# Patient Record
Sex: Male | Born: 1960 | Race: White | Hispanic: No | Smoking: Current every day smoker
Health system: Southern US, Community
[De-identification: ages and names within clinical notes are randomized; demographics above are authoritative.]

## PROBLEM LIST (undated history)

## (undated) DIAGNOSIS — F909 Attention-deficit hyperactivity disorder, unspecified type: Secondary | ICD-10-CM

---

## 2000-10-19 ENCOUNTER — Emergency Department (HOSPITAL_COMMUNITY): Admission: EM | Admit: 2000-10-19 | Discharge: 2000-10-19 | Payer: Self-pay | Admitting: Emergency Medicine

## 2000-10-19 ENCOUNTER — Encounter: Payer: Self-pay | Admitting: Emergency Medicine

## 2007-08-26 ENCOUNTER — Emergency Department: Payer: Self-pay

## 2014-09-23 ENCOUNTER — Emergency Department (HOSPITAL_COMMUNITY): Payer: Medicare Other

## 2014-09-23 ENCOUNTER — Emergency Department (HOSPITAL_COMMUNITY)
Admission: EM | Admit: 2014-09-23 | Discharge: 2014-09-23 | Disposition: A | Discharge status: Home / Self Care | Payer: Medicare Other | Attending: Emergency Medicine | Admitting: Emergency Medicine

## 2014-09-23 ENCOUNTER — Encounter (HOSPITAL_COMMUNITY): Payer: Self-pay | Admitting: Emergency Medicine

## 2014-09-23 DIAGNOSIS — Y9289 Other specified places as the place of occurrence of the external cause: Secondary | ICD-10-CM | POA: Diagnosis not present

## 2014-09-23 DIAGNOSIS — Z720 Tobacco use: Secondary | ICD-10-CM | POA: Insufficient documentation

## 2014-09-23 DIAGNOSIS — S8262XA Displaced fracture of lateral malleolus of left fibula, initial encounter for closed fracture: Secondary | ICD-10-CM | POA: Diagnosis not present

## 2014-09-23 DIAGNOSIS — Y9389 Activity, other specified: Secondary | ICD-10-CM | POA: Diagnosis not present

## 2014-09-23 DIAGNOSIS — Z8659 Personal history of other mental and behavioral disorders: Secondary | ICD-10-CM | POA: Diagnosis not present

## 2014-09-23 DIAGNOSIS — W11XXXA Fall on and from ladder, initial encounter: Secondary | ICD-10-CM | POA: Diagnosis not present

## 2014-09-23 DIAGNOSIS — Y998 Other external cause status: Secondary | ICD-10-CM | POA: Insufficient documentation

## 2014-09-23 DIAGNOSIS — S51802A Unspecified open wound of left forearm, initial encounter: Secondary | ICD-10-CM | POA: Insufficient documentation

## 2014-09-23 DIAGNOSIS — R001 Bradycardia, unspecified: Secondary | ICD-10-CM | POA: Insufficient documentation

## 2014-09-23 DIAGNOSIS — Z23 Encounter for immunization: Secondary | ICD-10-CM | POA: Insufficient documentation

## 2014-09-23 DIAGNOSIS — S51012A Laceration without foreign body of left elbow, initial encounter: Secondary | ICD-10-CM | POA: Diagnosis not present

## 2014-09-23 DIAGNOSIS — W19XXXA Unspecified fall, initial encounter: Secondary | ICD-10-CM

## 2014-09-23 DIAGNOSIS — S8992XA Unspecified injury of left lower leg, initial encounter: Secondary | ICD-10-CM | POA: Diagnosis present

## 2014-09-23 HISTORY — DX: Attention-deficit hyperactivity disorder, unspecified type: F90.9

## 2014-09-23 MED ORDER — OXYCODONE-ACETAMINOPHEN 5-325 MG PO TABS: 1.0000 | ORAL_TABLET | Freq: Three times a day (TID) | ORAL | Status: AC | PRN

## 2014-09-23 MED ORDER — OXYCODONE-ACETAMINOPHEN 5-325 MG PO TABS
2.0000 | ORAL_TABLET | Freq: Once | ORAL | Status: DC
  Filled 2014-09-23: qty 2

## 2014-09-23 MED ORDER — KETOROLAC TROMETHAMINE 60 MG/2ML IM SOLN
30.0000 mg | Freq: Once | INTRAMUSCULAR | Status: AC
  Administered 2014-09-23: 30 mg via INTRAMUSCULAR
  Filled 2014-09-23: qty 2

## 2014-09-23 MED ORDER — TETANUS-DIPHTH-ACELL PERTUSSIS 5-2.5-18.5 LF-MCG/0.5 IM SUSP
0.5000 mL | Freq: Once | INTRAMUSCULAR | Status: AC
  Administered 2014-09-23: 0.5 mL via INTRAMUSCULAR
  Filled 2014-09-23: qty 0.5

## 2014-09-23 NOTE — ED Notes (Signed)
Ortho tech called and is on the way down to apply cam walker and provide pt with crutches.

## 2014-09-23 NOTE — ED Notes (Signed)
Ortho at bedside.

## 2014-09-23 NOTE — ED Notes (Signed)
Pt. Stated, I fell from a ladder working on ceiling.  I fell on the left side. My arm is hurt and numb, my back hurts, knee, and ankle. Happened about 30 minutes ago.

## 2014-09-23 NOTE — ED Notes (Signed)
Ortho tech brought crutches to pt.  Pt refuses crutches.  RN explained to pt that no weight could be placed on left foot.  Pt verbalized understanding but still adamantly refused the crutches.  Pt escorted out by wheelchair.

## 2014-09-23 NOTE — ED Notes (Signed)
Went in to discuss plan of care with pt.  Pt is wishing to refuse hand Xray at this time.  Pt sts "It's not broken and I'm just ready to go home after they get my leg fixed up."  MD made aware.

## 2014-09-23 NOTE — ED Provider Notes (Signed)
CSN: [id number]     Arrival date & time 09/23/14  1136 History   First MD Initiated Contact with Patient 09/23/14 1422     Chief Complaint  Patient presents with  . Fall  . Arm Pain  . Back Pain  . Knee Injury     (Consider location/radiation/quality/duration/timing/severity/associated sxs/prior Treatment) HPI   53 year old male presents to the ER for evaluation of a recent fall. Patient fell approximately 3 hours ago off a ladder while working on a ceiling. 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 He was applying sheet rock while standing on a 6 foot ladder when a coworker accidentally walked by and knocked the ladder causing him to fell backward striking body against wall and concrete floor. He hits several middle pipes that was stuck out in the wall when he fell. He denies hitting his head or loss of consciousness. He is complaining of acute onset of left elbow, left wrist, left hand, lower back, and left ankle pain from the fall. Pain is sharp, throbbing, achy, worsening with palpation and movement. No specific treatment tried. Patient is not on any blood thinning medication. She reported initially his left arm was numb but that has resolved. He denies any precipitating symptoms prior to the fall. He denies wearing any head protective gear. No complaint of neck pain, chest pain, abdominal pain, trouble breathing, new numbness or weakness.  Past Medical History  Diagnosis Date  . ADHD (attention deficit hyperactivity disorder)    History reviewed. No pertinent past surgical history. No family history on file. History  Substance Use Topics  . Smoking status: Current Every Day Smoker  . Smokeless tobacco: Not on file  . Alcohol Use: No    Review of Systems  All other systems reviewed and are negative.     Allergies  Review of patient's allergies indicates not on file.  Home Medications   Prior to Admission medications    Not on File   BP 111/59 mmHg  Pulse 49  Temp(Src) 98 F (36.7 C) (Oral)  Resp 12  SpO2 97% Physical Exam  Constitutional: He appears well-developed and well-nourished. No distress.  HENT:  Head: Atraumatic.  No scalp tenderness, no mid face tenderness  Eyes: Conjunctivae and EOM are normal. Pupils are equal, round, and reactive to light.  Neck: Normal range of motion. Neck supple.  No cervical midline spine tenderness  Cardiovascular:  Bradycardia without murmur rubs or gallops  Pulmonary/Chest: Effort normal and breath sounds normal. He exhibits no tenderness.  Abdominal: Soft. There is no tenderness.  Musculoskeletal: He exhibits tenderness (Left elbow: Tenderness to medial epicondyles with normal elbow flexion extension. Left wrist with full range of motion and no deformity. Tenderness to pad of left third finger with swelling.).  Left ankle with moderate tenderness to lateral malleolus region without crepitus. Increased pain with ankle dorsi and plantarflexion and inversion and eversion. Pedal pulse palpable with brisk cap refill.  Neurological: He is alert.  Skin: No rash noted.  A 5 cm superficial skin tear noted to old aspects of proximal left forearm without active bleeding, or deep cut. No crepitus.  A 1 cm superficial skin tear to medial aspects of left elbow without deep laceration.  Psychiatric: He has a normal mood and affect.  Nursing note and vitals reviewed.   ED Course  Procedures (including critical care time)  2:47 PM Patient fell off a 6 foot ladder and striking left side of body against  the wall and the ground. X-ray obtain demonstrating an avulsion fracture on the left ankle. This is a closed injury. Cam walker will be placed, non weight bearing, crutches given.  Pain medication given. Patient has superficial skin tear that would be cleans, and dressed appropriately. Tetanus shot given. Patient will receive a work note and also will be given orthopedic  referral for outpatient follow-up.  LACERATION REPAIR Performed by: Fayrene HelperRAN,Adra Shepler Authorized byFayrene Helper: Justene Jensen Consent: Verbal consent obtained. Risks and benefits: risks, benefits and alternatives were discussed Consent given by: patient Patient identity confirmed: provided demographic data Prepped and Draped in normal sterile fashion Wound explored  Laceration Location: L elbow  Laceration Length: 1cm  No Foreign Bodies seen or palpated  Anesthesia: local infiltration  Local anesthetic: none  Anesthetic total: 0 ml  Irrigation method: syringe Amount of cleaning: standard  Skin closure: sterile strip  Number of sutures: sterile strip  Technique: sterile strip  Patient tolerance: Patient tolerated the procedure well with no immediate complications.    Labs Review Labs Reviewed - No data to display  Imaging Review Dg Lumbar Spine Complete  09/23/2014   CLINICAL DATA:  Status post fall from ladder today were now with low back pain and stiffness  EXAM: LUMBAR SPINE - COMPLETE 4+ VIEW  COMPARISON:  None.  FINDINGS: The lumbar vertebral bodies are preserved in height. The intervertebral disc space heights are well maintained. There are small anterior endplate spurs at multiple levels. There is no spondylolisthesis. There is no spinous process fracture. The pedicles and transverse processes are intact where visualized. The observed portions of the sacrum are normal.  IMPRESSION: There is no acute bony abnormality of the lumbar spine. Very mild degenerative endplate spurring is present at multiple levels.   Electronically Signed   By: David  Swazil[place]Jordan   On: 09/23/2014 13:38   Dg Elbow Complete Left  09/23/2014   CLINICAL DATA:  Larey SeatFell off ladder today work. Lacerations to medial left elbow.  EXAM: LEFT ELBOW - COMPLETE 3+ VIEW  COMPARISON:  None.  FINDINGS: The elbow is located. There are degenerative changes at the elbow joint, with some bony spurring. No fracture. Elbow is located.  Negative for joint effusion or focal soft tissue swelling.  IMPRESSION: No acute osseous abnormality or joint effusion identified.  Degenerative changes of the elbow joint.   Electronically Signed   By: Britta MccreedySusan  Turner M.D.   On: 09/23/2014 13:34   Dg Wrist Complete Left  09/23/2014   CLINICAL DATA:  Larey SeatFell off ladder today at work, ulnar pain LEFT wrist, initial encounter  EXAM: LEFT WRIST - COMPLETE 3+ VIEW  COMPARISON:  None  FINDINGS: Osseous mineralization normal.  Joint spaces preserved.  No fracture, dislocation, or bone destruction.  IMPRESSION: Normal exam.   Electronically Signed   By: Ulyses SouthwardMark  Boles M.D.   On: 09/23/2014 13:33   Dg Ankle Complete Left  09/23/2014   CLINICAL DATA:  Status post fall from a ladder today or now with left lateral malleolar region pain  EXAM: LEFT ANKLE COMPLETE - 3+ VIEW  COMPARISON:  None.  FINDINGS: The ankle joint mortise is preserved. The talar dome is intact. There is a bony density which projects along the posterior lateral cortex of the lateral malleolus which may reflect an avulsion fracture fragment. It is demonstrated best on the lateral film but a tiny bony density is faintly visible on the AP view. The medial malleolus exhibits degenerative change. The posterior malleolus is intact. There is a plantar calcaneal  spur.  IMPRESSION: There is a tiny bony density which may reflect an avulsion from the posterior cortex of the lateral malleolus or possibly from the adjacent calcaneus. There is no dislocation.   Electronically Signed   By: David  [place]   On: 09/23/2014 13:35     EKG Interpretation None      MDM   Final diagnoses:  Fall from ladder, initial encounter  Fractured lateral malleolus, left, closed, initial encounter  Skin tear of elbow without complication, left, initial encounter    BP 117/61 mmHg  Pulse 46  Temp(Src) 98 F (36.7 C) (Oral)  Resp 16  SpO2 97% Asymptomatic bradycardia.     I have reviewed nursing notes and vital  signs. I personally reviewed the imaging tests through PACS system  I reviewed available ER/hospitalization records thought the EMR     Fayrene HelperBowie Latresha Yahr, PA-C 09/23/14 1532  Lyanne CoKevin M Campos, MD 09/23/14 [phone]

## 2014-09-23 NOTE — Progress Notes (Signed)
Orthopedic Tech Progress Note Patient Details:  Daniel CoilMark A Espinoza 1961-05-03 [id number] CAM walker applied. Crutches refused. Patient given dc instructions and RICE method explained.  Ortho Devices Type of Ortho Device: CAM walker Ortho Device/Splint Location: LLE Ortho Device/Splint Interventions: Application   Asia R Thompson 09/23/2014, 3:42 PM

## 2014-09-23 NOTE — Discharge Instructions (Signed)
You have a broken left ankle.  Do not bear weight, wear cam walker and use crutches to walk.  Follow up with orthopedic specialist next week for further care.  Take pain medication as needed but do not drive or operate heavy machinery while taking pain medication.  If you notice signs of infection from the wound, please return.    Ankle Fracture A fracture is a break in a bone. The ankle joint is made up of three bones. These include the lower (distal)sections of your lower leg bones, called the tibia and fibula, along with a bone in your foot, called the talus. Depending on how bad the break is and if more than one ankle joint bone is broken, a cast or splint is used to protect and keep your injured bone from moving while it heals. Sometimes, surgery is required to help the fracture heal properly.  There are two general types of fractures:  Stable fracture. This includes a single fracture line through one bone, with no injury to ankle ligaments. A fracture of the talus that does not have any displacement (movement of the bone on either side of the fracture line) is also stable.  Unstable fracture. This includes more than one fracture line through one or more bones in the ankle joint. It also includes fractures that have displacement of the bone on either side of the fracture line. CAUSES  A direct blow to the ankle.   Quickly and severely twisting your ankle.  Trauma, such as a car accident or falling from a significant height. RISK FACTORS You may be at a higher risk of ankle fracture if:  You have certain medical conditions.  You are involved in high-impact sports.  You are involved in a high-impact car accident. SIGNS AND SYMPTOMS   Tender and swollen ankle.  Bruising around the injured ankle.  Pain on movement of the ankle.  Difficulty walking or putting weight on the ankle.  A cold foot below the site of the ankle injury. This can occur if the blood vessels passing through  your injured ankle were also damaged.  Numbness in the foot below the site of the ankle injury. DIAGNOSIS  An ankle fracture is usually diagnosed with a physical exam and X-rays. A CT scan may also be required for complex fractures. TREATMENT  Stable fractures are treated with a cast or splint and using crutches to avoid putting weight on your injured ankle. This is followed by an ankle strengthening program. Some patients require a special type of cast, depending on other medical problems they may have. Unstable fractures require surgery to ensure the bones heal properly. Your health care provider will tell you what type of fracture you have and the best treatment for your condition. HOME CARE INSTRUCTIONS   Review correct crutch use with your health care provider and use your crutches as directed. Safe use of crutches is extremely important. Misuse of crutches can cause you to fall or cause injury to nerves in your hands or armpits.  Do not put weight or pressure on the injured ankle until directed by your health care provider.  To lessen the swelling, keep the injured leg elevated while sitting or lying down.  Apply ice to the injured area:  Put ice in a plastic bag.  Place a towel between your cast and the bag.  Leave the ice on for 20 minutes, 2-3 times a day.  If you have a plaster or fiberglass cast:  Do not try  to scratch the skin under the cast with any objects. This can increase your risk of skin infection.  Check the skin around the cast every day. You may put lotion on any red or sore areas.  Keep your cast dry and clean.  If you have a plaster splint:  Wear the splint as directed.  You may loosen the elastic around the splint if your toes become numb, tingle, or turn cold or blue.  Do not put pressure on any part of your cast or splint; it may break. Rest your cast only on a pillow the first 24 hours until it is fully hardened.  Your cast or splint can be protected  during bathing with a plastic bag sealed to your skin with medical tape. Do not lower the cast or splint into water.  Take medicines as directed by your health care provider. Only take over-the-counter or prescription medicines for pain, discomfort, or fever as directed by your health care provider.  Do not drive a vehicle until your health care provider specifically tells you it is safe to do so.  If your health care provider has given you a follow-up appointment, it is very important to keep that appointment. Not keeping the appointment could result in a chronic or permanent injury, pain, and disability. If you have any problem keeping the appointment, call the facility for assistance. SEEK MEDICAL CARE IF: You develop increased swelling or discomfort. SEEK IMMEDIATE MEDICAL CARE IF:   Your cast gets damaged or breaks.  You have continued severe pain.  You develop new pain or swelling after the cast was put on.  Your skin or toenails below the injury turn blue or gray.  Your skin or toenails below the injury feel cold, numb, or have loss of sensitivity to touch.  There is a bad smell or pus draining from under the cast. MAKE SURE YOU:   Understand these instructions.  Will watch your condition.  Will get help right away if you are not doing well or get worse. Document Released: 10/11/2000 Document Revised: 10/19/2013 Document Reviewed: 05/13/2013 [facility] Patient Information 2015 [place], [place]. This information is not intended to replace advice given to you by your health care provider. Make sure you discuss any questions you have with your health care provider.  Stitches, Staples, or Skin Adhesive Strips  Stitches (sutures), staples, and skin adhesive strips hold the skin together as it heals. They will usually be in place for 7 days or less. HOME CARE  Wash your hands with soap and water before and after you touch your wound.  Only take medicine as told by your  doctor.  Cover your wound only if your doctor told you to. Otherwise, leave it open to air.  Do not get your stitches wet or dirty. If they get dirty, dab them gently with a clean washcloth. Wet the washcloth with soapy water. Do not rub. Pat them dry gently.  Do not put medicine or medicated cream on your stitches unless your doctor told you to.  Do not take out your own stitches or staples. Skin adhesive strips will fall off by themselves.  Do not pick at the wound. Picking can cause an infection.  Do not miss your follow-up appointment.  If you have problems or questions, call your doctor. GET HELP RIGHT AWAY IF:   You have a temperature by mouth above 102 F (38.9 C), not controlled by medicine.  You have chills.  You have redness or pain around  your stitches.  There is puffiness (swelling) around your stitches.  You notice fluid (drainage) from your stitches.  There is a bad smell coming from your wound. MAKE SURE YOU:  Understand these instructions.  Will watch your condition.  Will get help if you are not doing well or get worse. Document Released: 08/11/2009 Document Revised: 01/06/2012 Document Reviewed: 08/11/2009 [facility] Patient Information 2015 [place]. This information is not intended to replace advice given to you by your health care provider. Make sure you discuss any questions you have with your health care provider.

## 2016-01-03 IMAGING — CR DG ANKLE COMPLETE 3+V*L*
3 series · 3 of 3 positions shown · non-contrast
Comparison: None.

CLINICAL DATA: Status post fall from a ladder today or now with
left lateral malleolar region pain

EXAM:
LEFT ANKLE COMPLETE - 3+ VIEW

[ankle ap]
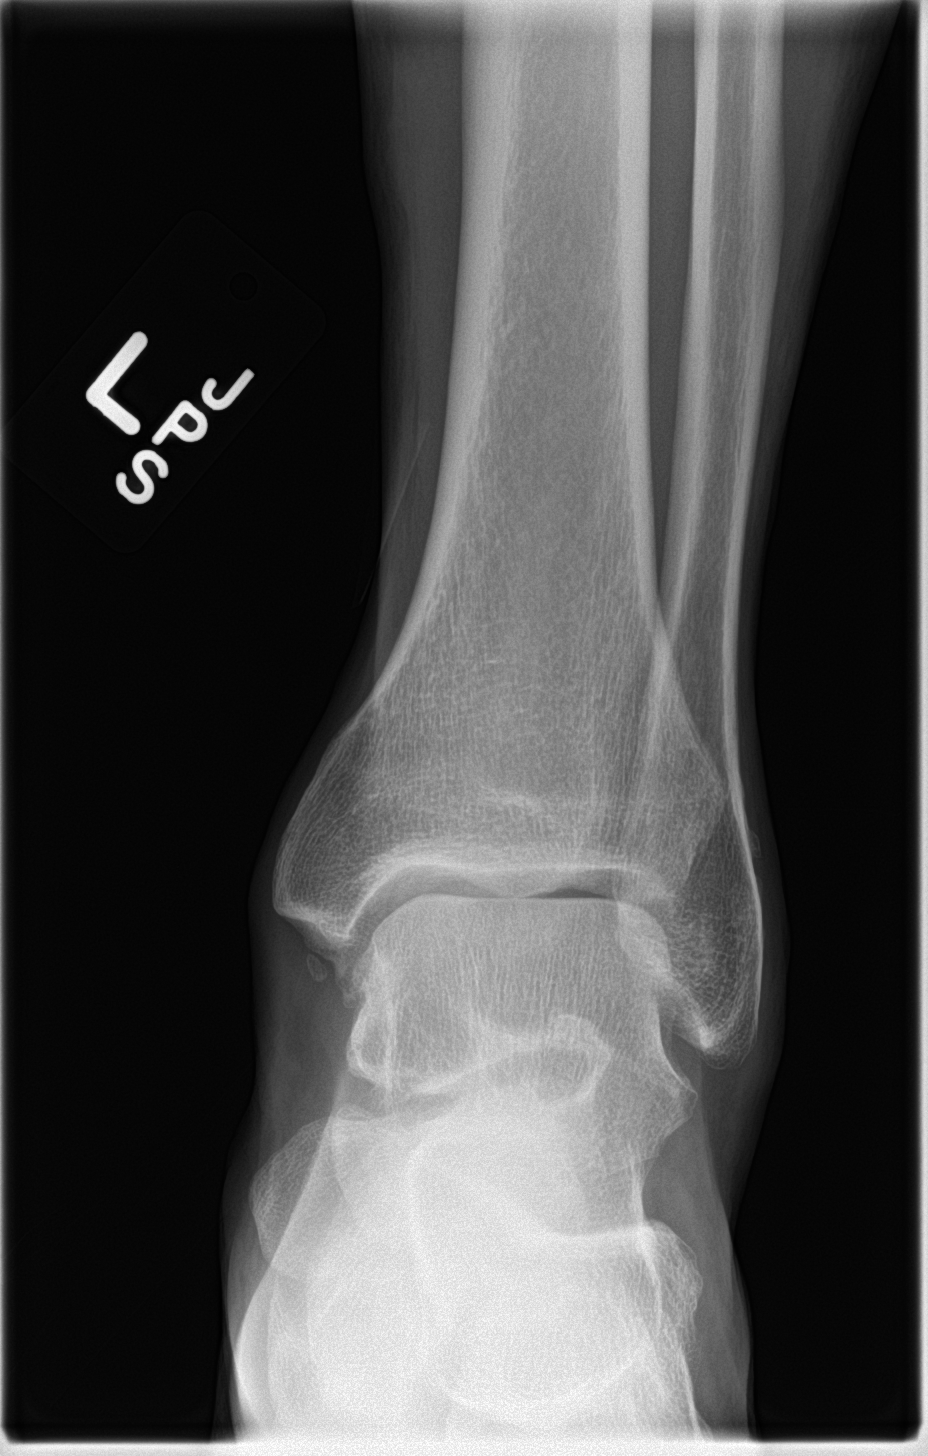

[ankle obl]
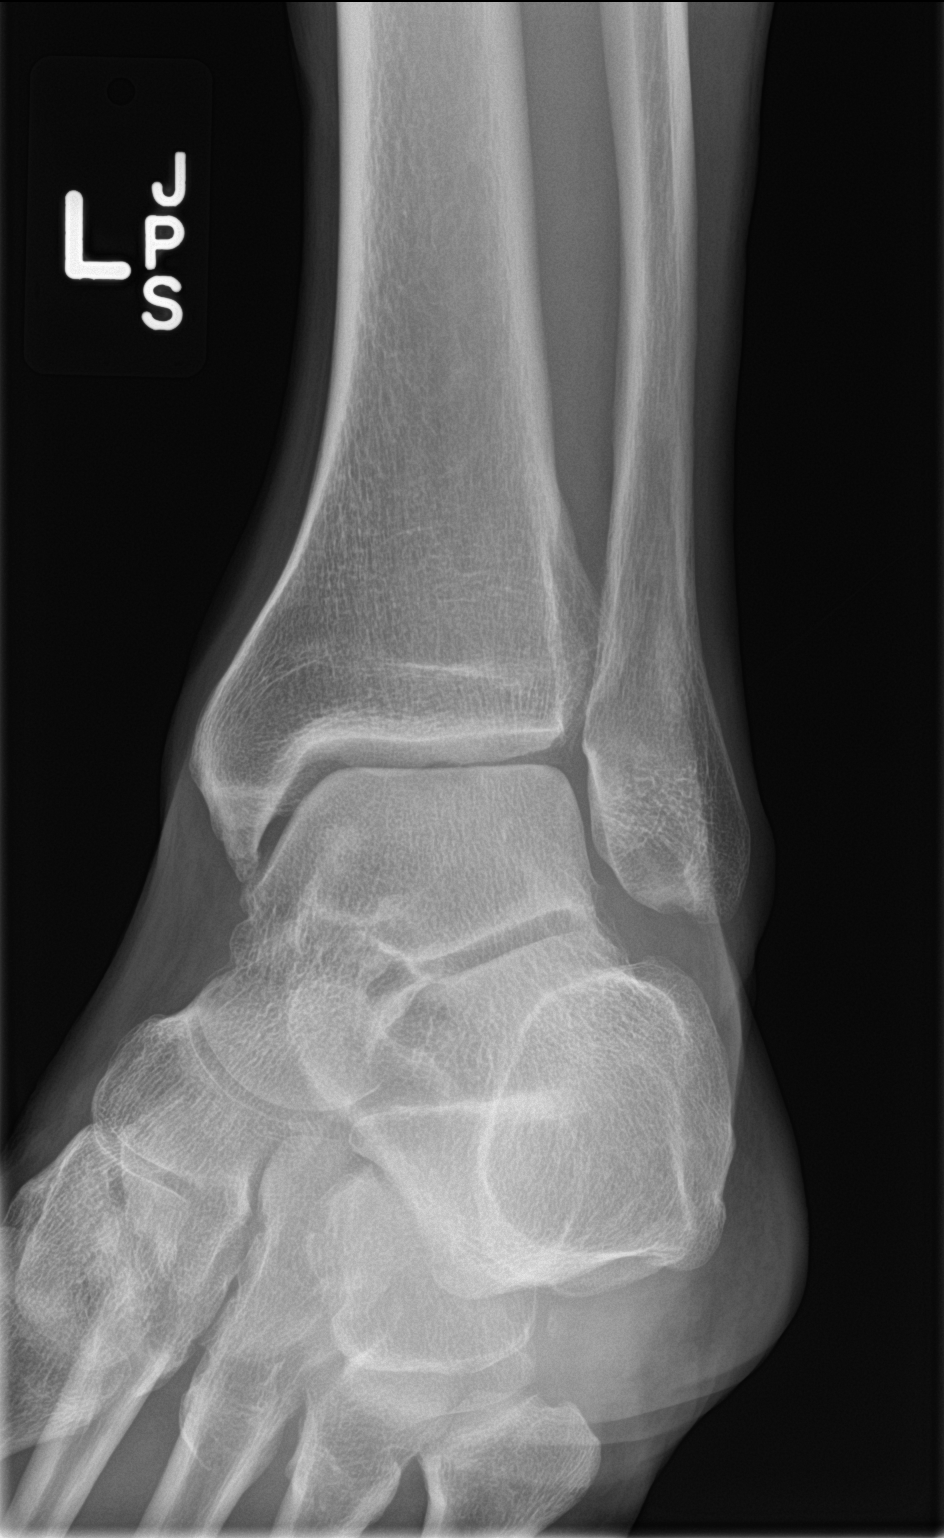

[ankle lat]
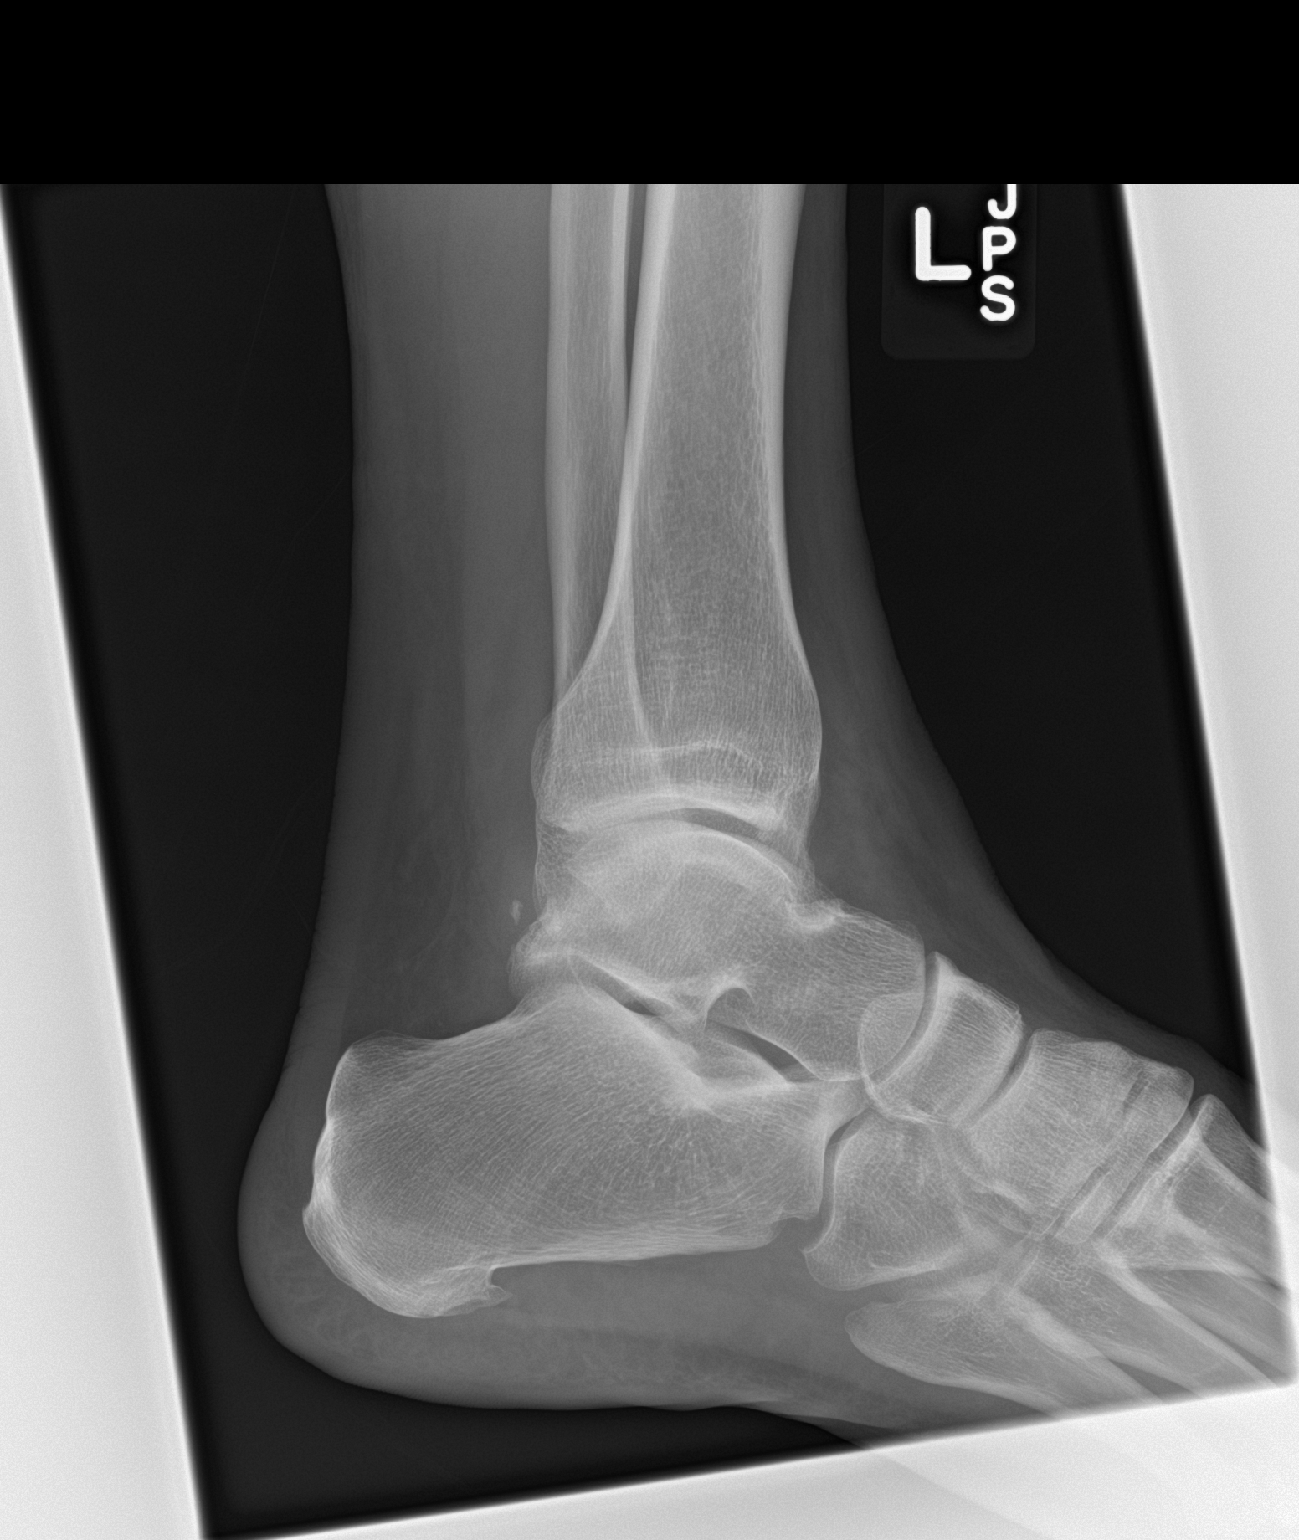

[3 of 3 positions shown; findings below may reference images not displayed]

FINDINGS: The ankle joint mortise is preserved. The talar dome is intact.
There is a bony density which projects along the posterior lateral
cortex of the lateral malleolus which may reflect an avulsion
fracture fragment. It is demonstrated best on the lateral film but a
tiny bony density is faintly visible on the AP view. The medial
malleolus exhibits degenerative change. The posterior malleolus is
intact. There is a plantar calcaneal spur.
IMPRESSION: There is a tiny bony density which may reflect an avulsion from the
posterior cortex of the lateral malleolus or possibly from the
adjacent calcaneus. There is no dislocation.

## 2016-01-03 IMAGING — CR DG ELBOW COMPLETE 3+V*L*
4 series · 4 of 4 positions shown · non-contrast
Comparison: None.

CLINICAL DATA: Fell off ladder today work. Lacerations to medial
left elbow.

EXAM:
LEFT ELBOW - COMPLETE 3+ VIEW

[elbow ap]
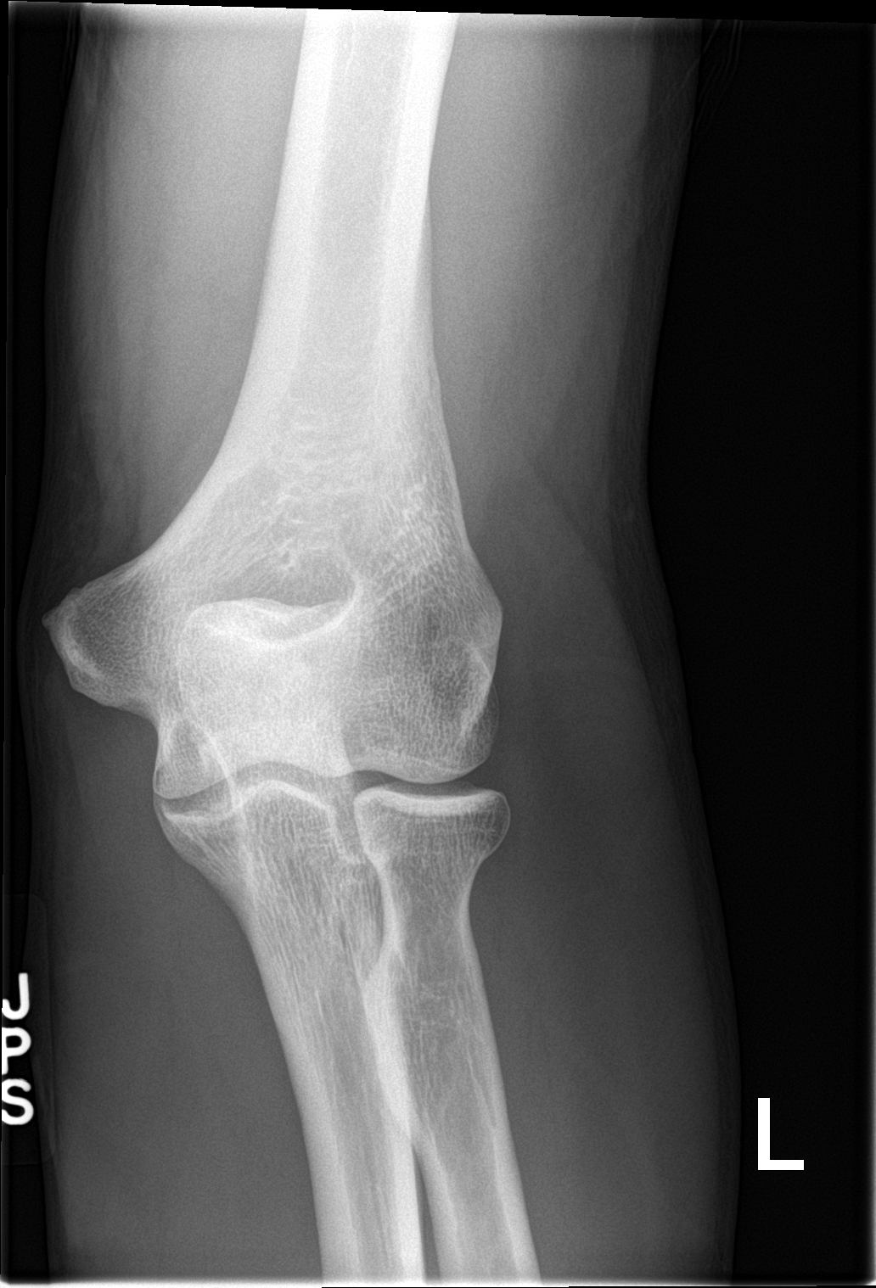

[elbow obl (1 of 2)]
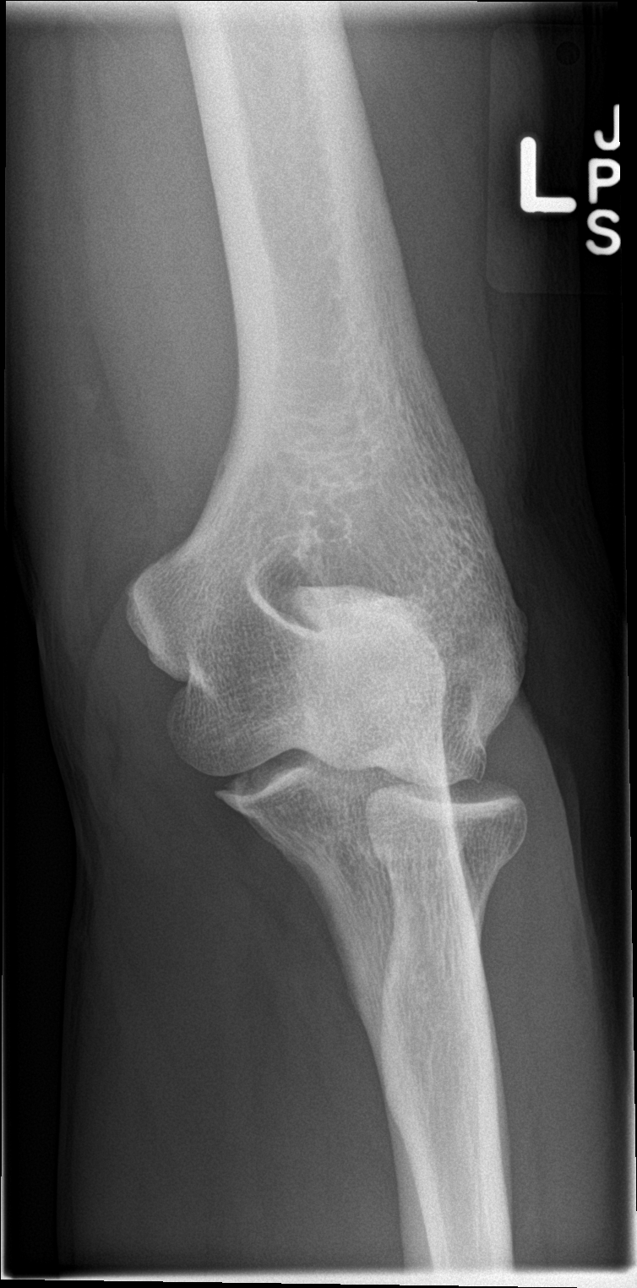

[elbow obl (2 of 2)]
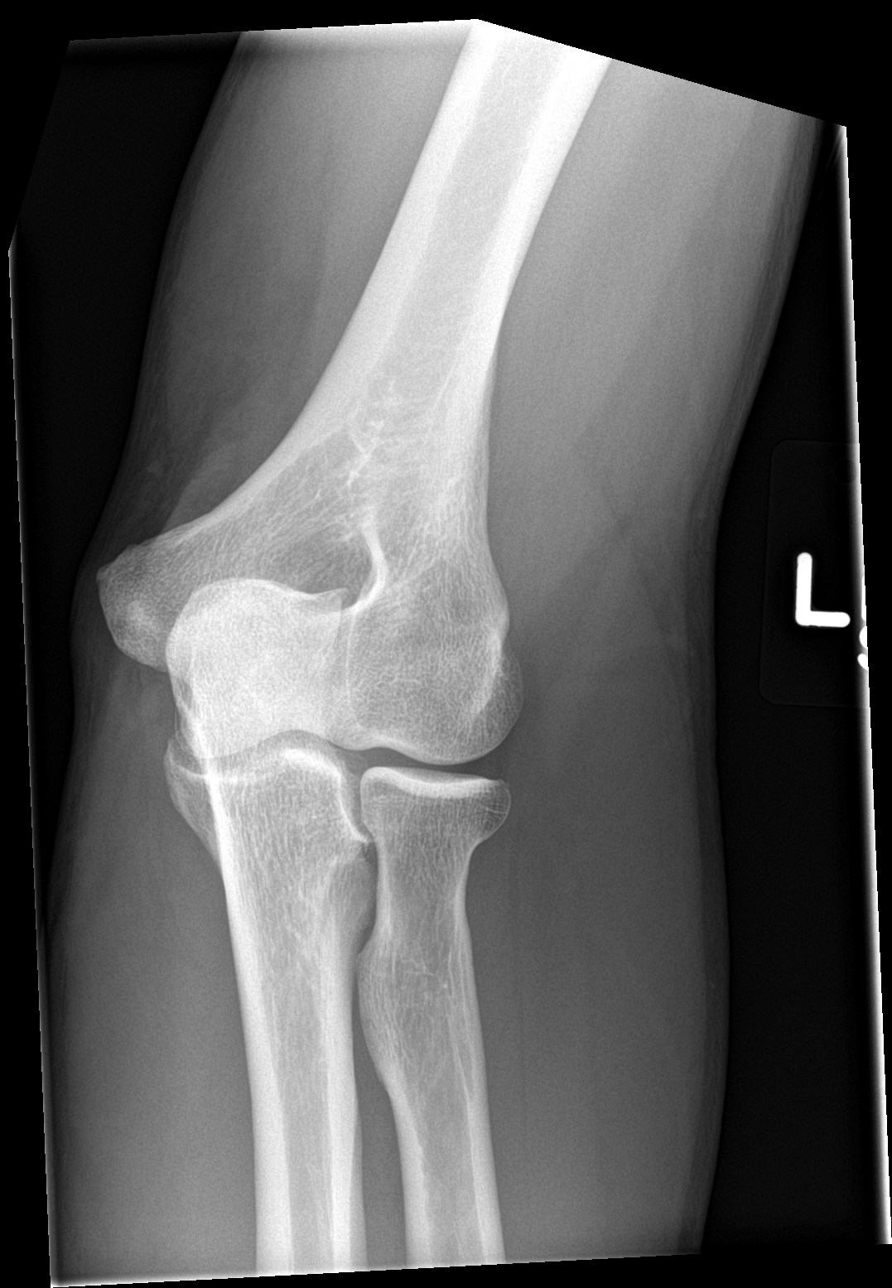

[elbow lat]
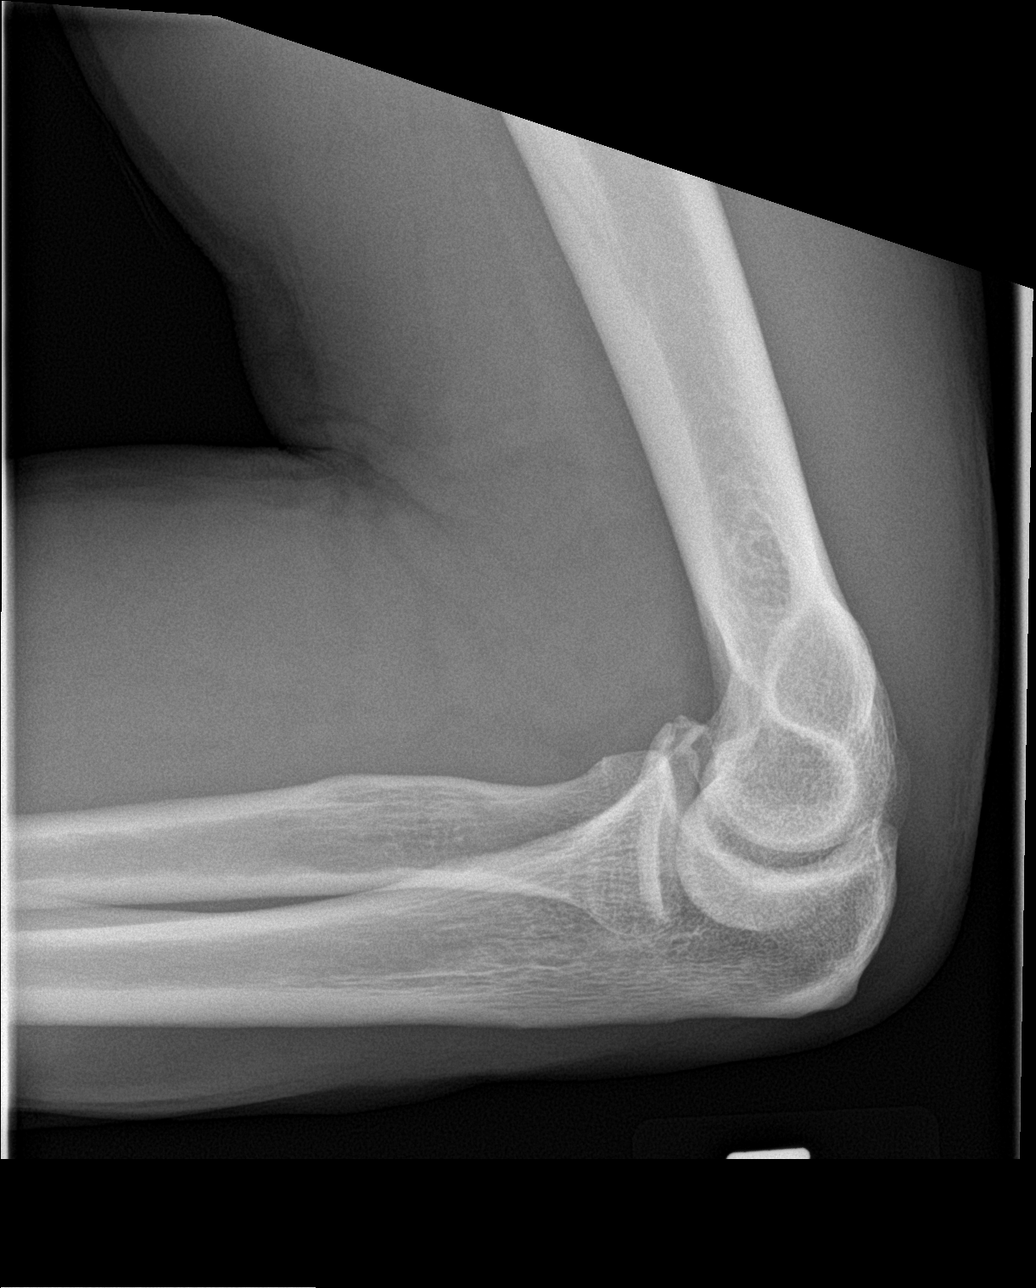

[4 of 4 positions shown; findings below may reference images not displayed]

FINDINGS: The elbow is located. There are degenerative changes at the elbow
joint, with some bony spurring. No fracture. Elbow is located.
Negative for joint effusion or focal soft tissue swelling.
IMPRESSION: No acute osseous abnormality or joint effusion identified.

Degenerative changes of the elbow joint.

## 2022-11-15 DIAGNOSIS — Z550 Illiteracy and low-level literacy: Secondary | ICD-10-CM | POA: Diagnosis not present

## 2022-11-15 DIAGNOSIS — M7989 Other specified soft tissue disorders: Secondary | ICD-10-CM | POA: Diagnosis not present

## 2022-11-22 DIAGNOSIS — L089 Local infection of the skin and subcutaneous tissue, unspecified: Secondary | ICD-10-CM | POA: Diagnosis not present

## 2022-11-22 DIAGNOSIS — Z550 Illiteracy and low-level literacy: Secondary | ICD-10-CM | POA: Diagnosis not present

## 2022-12-04 DIAGNOSIS — L03019 Cellulitis of unspecified finger: Secondary | ICD-10-CM | POA: Diagnosis not present
